# Patient Record
Sex: Male | Born: 2000 | Race: White | Hispanic: No | Marital: Single | Smoking: Never smoker
Health system: Southern US, Community
[De-identification: ages and names within clinical notes are randomized; demographics above are authoritative.]

## PROBLEM LIST (undated history)

## (undated) HISTORY — PX: WISDOM TOOTH EXTRACTION: SHX21

---

## 2019-09-07 ENCOUNTER — Encounter: Payer: Self-pay | Admitting: Emergency Medicine

## 2019-09-07 ENCOUNTER — Other Ambulatory Visit: Payer: Self-pay

## 2019-09-07 ENCOUNTER — Ambulatory Visit
Admission: EM | Admit: 2019-09-07 | Discharge: 2019-09-07 | Disposition: A | Discharge status: Home / Self Care | Payer: Self-pay | Attending: Urgent Care | Admitting: Urgent Care

## 2019-09-07 ENCOUNTER — Ambulatory Visit (INDEPENDENT_AMBULATORY_CARE_PROVIDER_SITE_OTHER): Payer: Self-pay

## 2019-09-07 DIAGNOSIS — M25461 Effusion, right knee: Secondary | ICD-10-CM

## 2019-09-07 DIAGNOSIS — M25561 Pain in right knee: Secondary | ICD-10-CM

## 2019-09-07 NOTE — ED Triage Notes (Signed)
Patient now states he had a knee injury. Patient was playing a game and he turned around too fast, fell and knee cap popped out of place.

## 2019-09-07 NOTE — ED Triage Notes (Signed)
Patient in today c/o right knee pain x 2 weeks. Patient unsure of any injury, but says his knee cap popped out and went back in.

## 2019-09-07 NOTE — ED Triage Notes (Signed)
DOI 08/21/19.

## 2019-09-07 NOTE — Discharge Instructions (Signed)
It was very nice seeing you today in clinic. Thank you for entrusting me with your care.   Xray shows possible ligamentous or retinacular injury. Recommending MRI for further evaluation. Will have you see orthopedics for further evaluation and consideration of the advanced imaging. I have provided you the name and office contact information for an excellent local provider.   You indicated that you did not want to take pain medication. I would suggest at least taking some over the counter ibuprofen to help with the pain. Continue to ice the knee. Rest and elevate knee when you can. Wear sleeve/brace.   If your symptoms/condition worsens, please seek follow up care either here or in the ER. Please remember, our [place] providers are "right here with you" when you need [place].   Again, it was my pleasure to take care of you today. Thank you for choosing our clinic. I hope that you start to feel better quickly.   Honor Loh, MSN, APRN, FNP-C, CEN Advanced Practice Provider [facility]

## 2019-09-07 NOTE — ED Provider Notes (Signed)
[place], [place]   Name: George Barnes DOB: 10/05/2000 MRN: [id number] CSN: [id number] PCP: Patient, No Pcp Per  Arrival date and time:  09/07/19 1037  Chief Complaint:  Knee Pain   NOTE: Prior to seeing the patient today, I have reviewed the triage nursing documentation and vital signs. Clinical staff has updated patient's PMH/PSHx, current medication list, and drug allergies/intolerances to ensure comprehensive history available to assist in medical decision making.   History:   HPI: George Barnes is a 18 y.o. male who presents today with complaints of RIGHT knee pain following an injury that occurred on 08/21/2019. Patient describes his injury to have occurred when he was playing spike ball. Patient reports that he turned too quickly and his knee popped. Patient participated in a tele-health visit with an orthopedic provider in [place] who advised him that he likely dislocated his knee, however based on symptoms, it sounded as if the knee as "popped back into place". Patient presents with pain overlying the patella and to the medial joint line. He denies previous injury to his knee; no surgeries. Patient has been using ice and warm water to help with his pain. He has not used any conventional interventions citing that he was concerned with possible drug reactions.   History reviewed. No pertinent past medical history.  Past Surgical History:  Procedure Laterality Date   WISDOM TOOTH EXTRACTION      Family History  Problem Relation Age of Onset   Healthy Mother    Healthy Father     Social History   Tobacco Use   Smoking status: Never Smoker   Smokeless tobacco: Never Used  Substance Use Topics   Alcohol use: Never   Drug use: Never    There are no problems to display for this patient.   Home Medications:    No outpatient medications have been marked as taking for the 09/07/19 encounter [facility](Hospital Encounter).    Allergies:   Other  Review of Systems  (ROS): Review of Systems  Constitutional: Negative for chills and fever.  Respiratory: Negative for cough and shortness of breath.   Cardiovascular: Negative for chest pain and palpitations.  Musculoskeletal: Positive for gait problem (2/2 acute pain) and joint swelling.       Acute RIGHT knee pain  Skin: Negative for color change, pallor and rash.  Neurological: Negative for weakness and numbness.  All other systems reviewed and are negative.    Vital Signs: Today's Vitals   09/07/19 1051 09/07/19 1052 09/07/19 1216  BP:  140/80   Pulse:  81   Resp:  18   Temp:  98.5 F (36.9 C)   TempSrc:  Oral   SpO2:  100%   Weight: 170 lb (77.1 kg)    Height: 6' (1.829 m)    PainSc: 4   4     Physical Exam: Physical Exam  Constitutional: He is oriented to person, place, and time and well-developed, well-nourished, and in no distress.  HENT:  Head: Normocephalic and atraumatic.  Mouth/Throat: Mucous membranes are normal.  Eyes: Pupils are equal, round, and reactive to light.  Cardiovascular: Normal rate and intact distal pulses.  Pulmonary/Chest: Effort normal. No respiratory distress.  Musculoskeletal:     Right knee: Swelling (mild) and effusion present. No deformity, erythema or ecchymosis. Normal range of motion. Tenderness present over the medial joint line and patellar tendon. Normal patellar mobility.  Neurological: He is alert and oriented to person, place, and time. He has normal sensation, normal strength  and normal reflexes. Gait normal.  Skin: Skin is warm and dry. No rash noted.  Psychiatric: Mood, memory, affect and judgment normal.  Nursing note and vitals reviewed.   Urgent Care Treatments / Results:   LABS: PLEASE NOTE: all labs that were ordered this encounter are listed, however only abnormal results are displayed. Labs Reviewed - No data to display  EKG: -None  RADIOLOGY: DG Knee Complete 4 Views Right  Result Date: 09/07/2019 CLINICAL DATA:  Pain after  twisting and falling on 08/23/2019 EXAM: RIGHT KNEE - COMPLETE 4+ VIEW COMPARISON:  None FINDINGS: Small joint effusion. Patella is mildly laterally tilted and mildly subluxed. No signs of avulsion injury from the patella. No visible fracture. IMPRESSION: Small joint effusion and mild patellar tilt and slight lateral translation of the patella, could be seen in the setting of previous dislocation of the patella or subluxation and resultant ligamentous or retinacular injury. MRI may be helpful for further assessment as warranted. Electronically Signed   By: Donzetta Kohut M.D.   On: 09/07/2019 11:43    PROCEDURES: Procedures  MEDICATIONS RECEIVED THIS VISIT: Medications - No data to display  PERTINENT CLINICAL COURSE NOTES/UPDATES:   Initial Impression / Assessment and Plan / Urgent Care Course:  Pertinent labs & imaging results that were available during my care of the patient were personally reviewed by me and considered in my medical decision making (see lab/imaging section of note for values and interpretations).  George Barnes is a 18 y.o. male who presents to [facility] today with complaints of Knee Pain   Patient is well appearing overall in clinic today. He does not appear to be in any acute distress. Presenting symptoms (see HPI) and exam as documented above. Diagnostic radiographs of the RIGHT knee revealed a small joint effusions and patellar abnormality. Concern for ligamentous or retinacular injury. MRI was suggested. Patient placed in a reactive knee sleeve for support and comfort. Discussed rest, ice, and elevation. Offered pain medication, however pain declined. Encouraged to at least to consider using IBU for pain and inflammation. Patient needs to be seen for further evaluation by orthopedics. Name and office contact information provided on today's AVS for Dr. Kennedy Bucker. Patient advised the he will need to contact the office to schedule an appointment to be seen.   I  have reviewed the follow up and strict return precautions for any new or worsening symptoms. Patient is aware of symptoms that would be deemed urgent/emergent, and would thus require further evaluation either here or in the emergency department. At the time of discharge, he verbalized understanding and consent with the discharge plan as it was reviewed with him. All questions were fielded by provider and/or clinic staff prior to patient discharge.    Final Clinical Impressions / Urgent Care Diagnoses:   Final diagnoses:  Acute pain of right knee  Effusion of right knee    New Prescriptions:  [place] Controlled Substance Registry consulted? Not Applicable  No orders of the defined types were placed in this encounter.   Recommended Follow up Care:  Patient encouraged to follow up with the following provider within the specified time frame, or sooner as dictated by the severity of his symptoms. As always, he was instructed that for any urgent/emergent care needs, he should seek care either here or in the emergency department for more immediate evaluation.  Follow-up Information    Schedule an appointment as soon as possible for a visit  with Kennedy Bucker, MD.  Specialty: Orthopedic Surgery Why: Need further evaluation of knee and to discuss possible MRI Contact information: [place] [address] [phone]         NOTE: This note was prepared using Dragon dictation software along with smaller phrase technology. Despite my best ability to proofread, there is the potential that transcriptional errors may still occur from this process, and are completely unintentional.    Karen Kitchens, NP 09/08/19 [phone]

## 2020-09-17 IMAGING — CR DG KNEE COMPLETE 4+V*R*
4 series · 4 of 4 positions shown · non-contrast
Comparison: None

CLINICAL DATA: Pain after twisting and falling on 08/23/2019

EXAM:
RIGHT KNEE - COMPLETE 4+ VIEW

[knee ap]
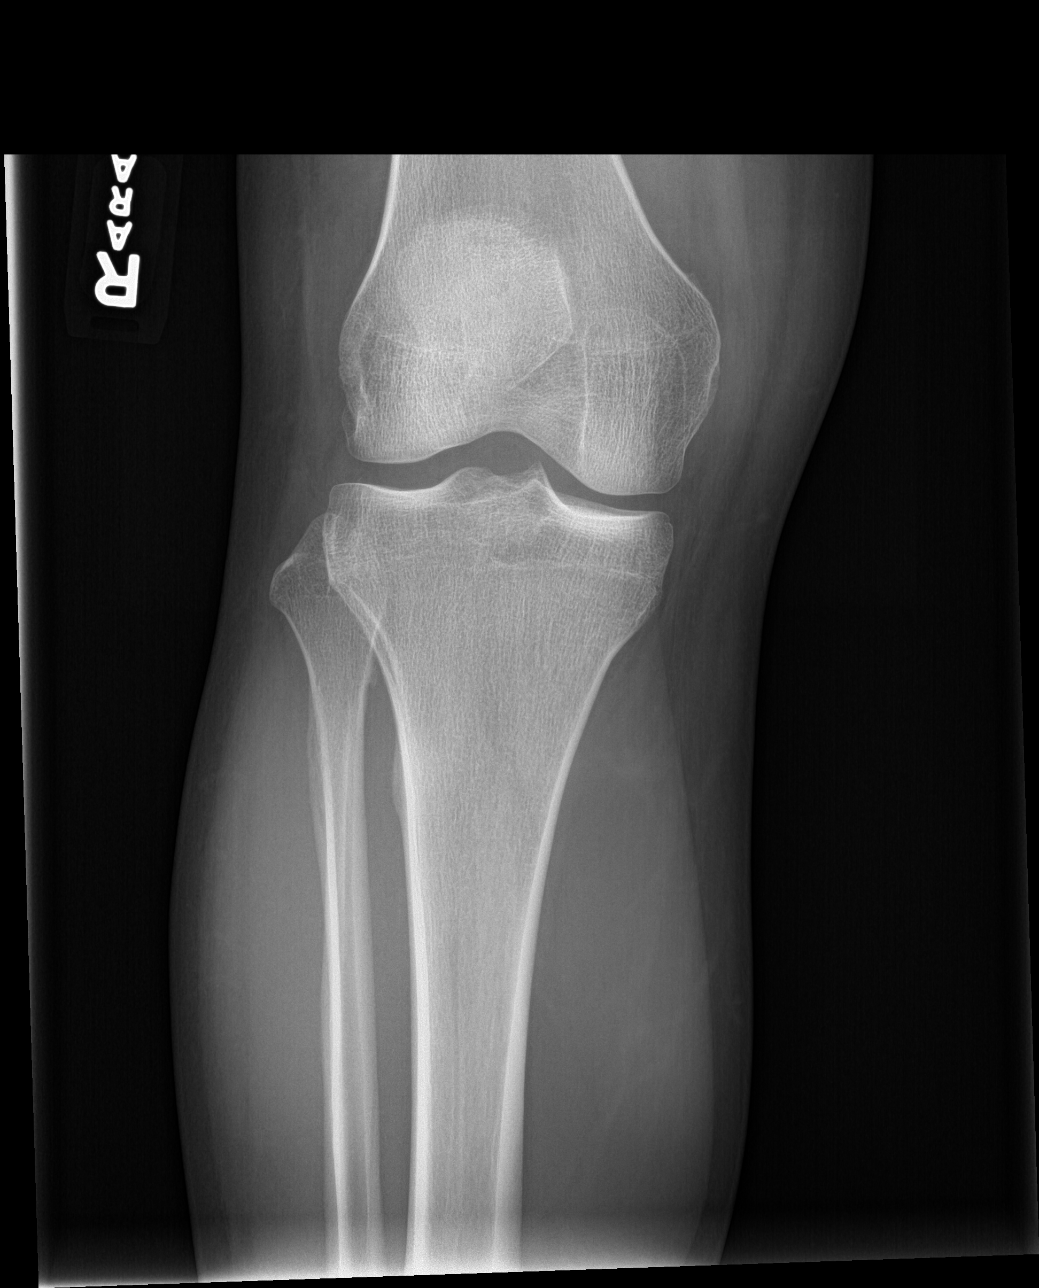

[knee lat (1 of 2)]
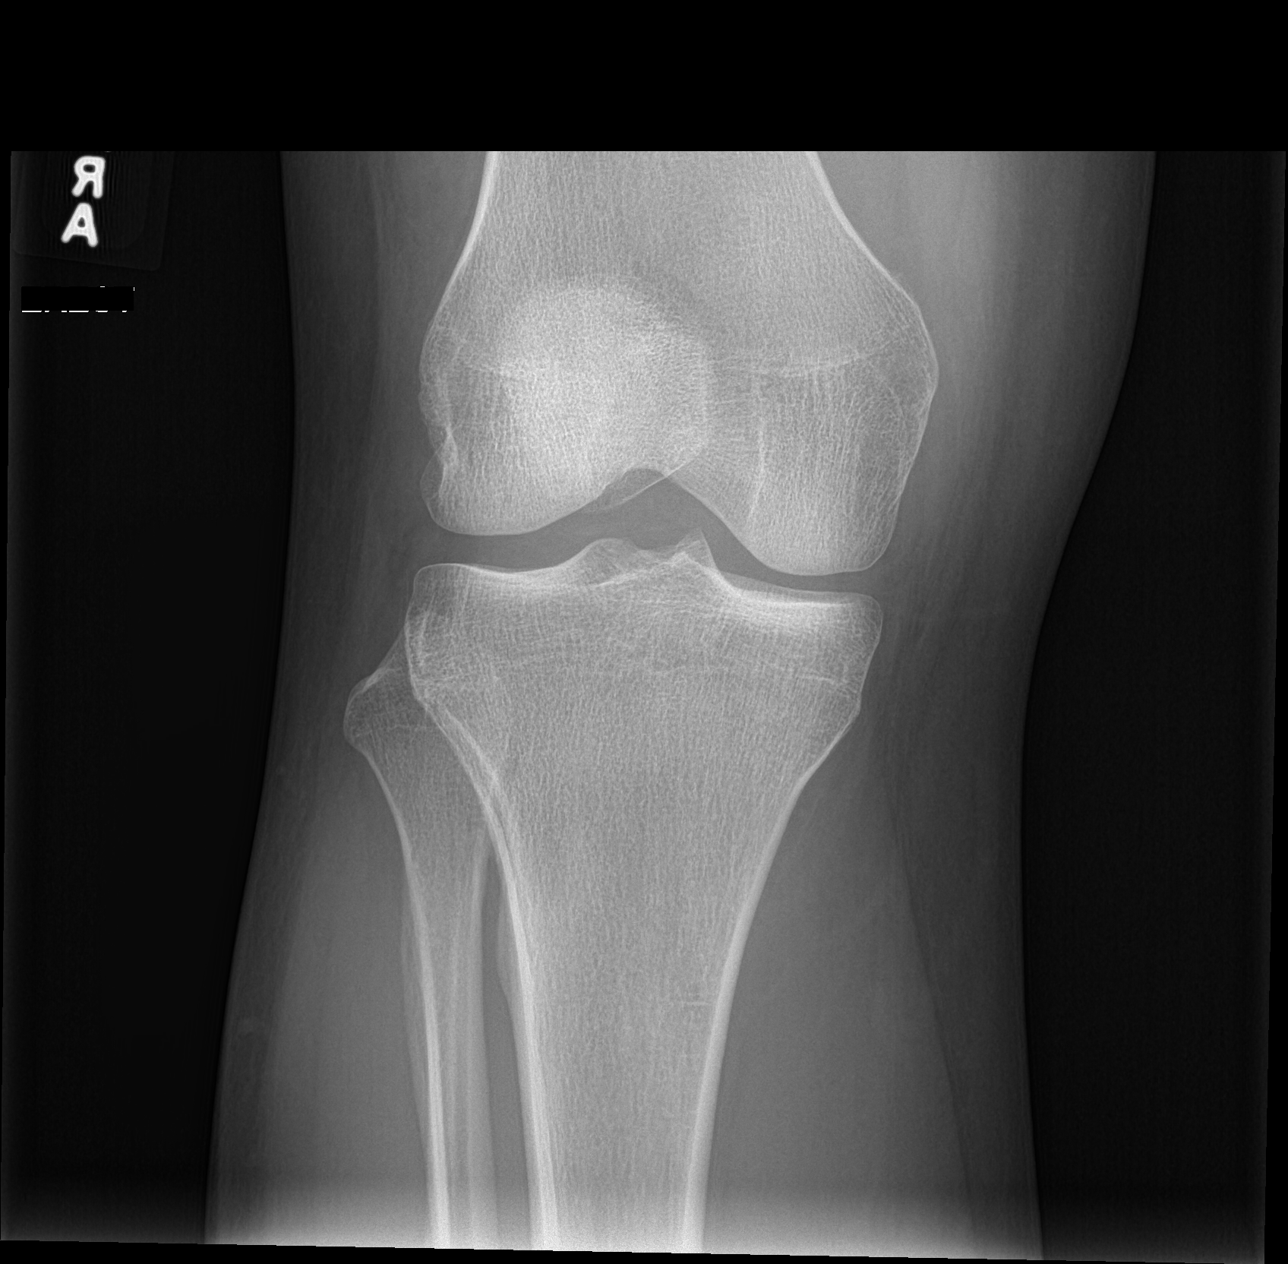

[knee lat (2 of 2)]
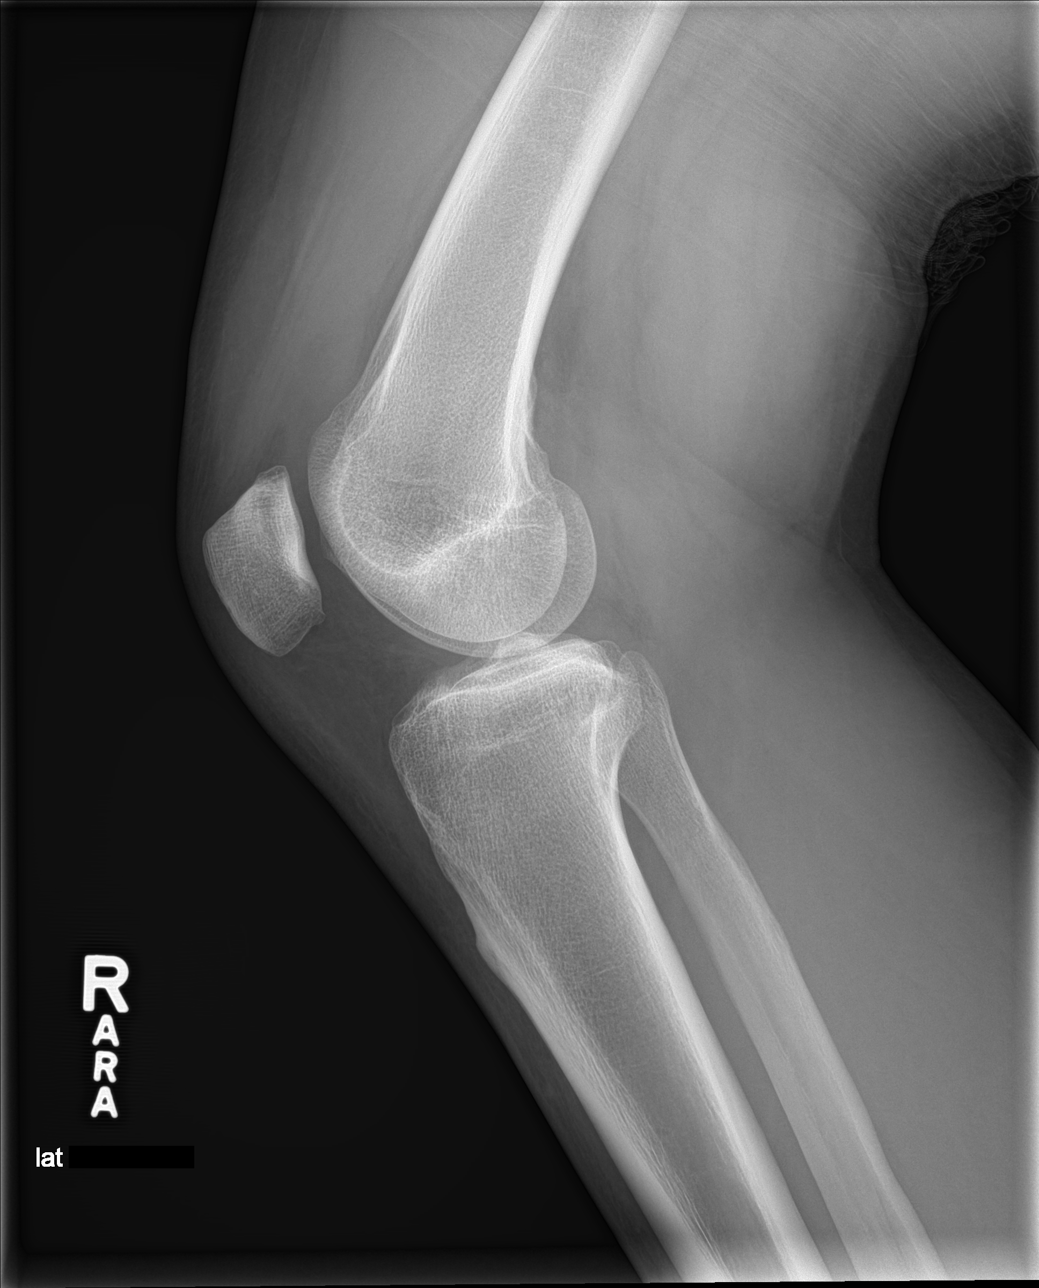

[patella skyline]
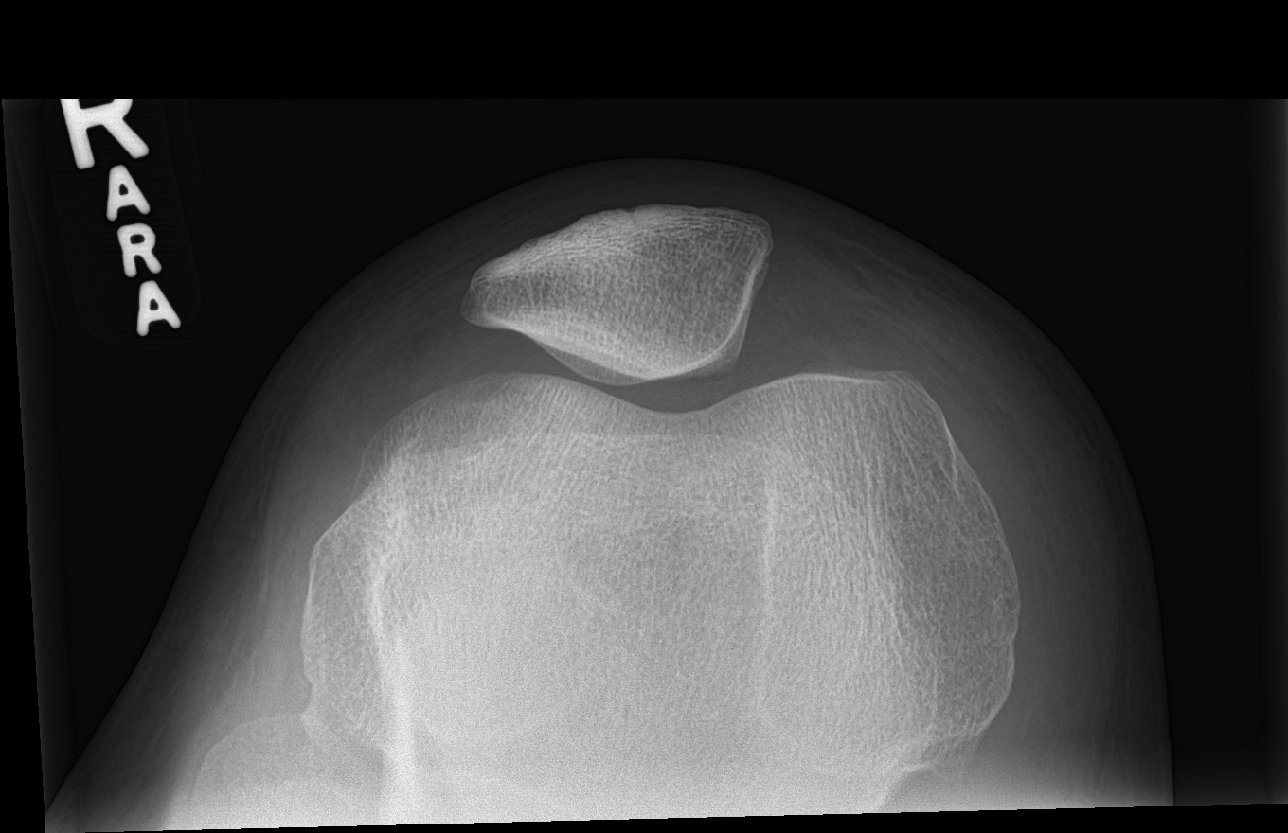

[4 of 4 positions shown; findings below may reference images not displayed]

FINDINGS: Small joint effusion. Patella is mildly laterally tilted and mildly
subluxed. No signs of avulsion injury from the patella. No visible
fracture.
IMPRESSION: Small joint effusion and mild patellar tilt and slight lateral
translation of the patella, could be seen in the setting of previous
dislocation of the patella or subluxation and resultant ligamentous
or retinacular injury. MRI may be helpful for further assessment as
warranted.
